# Patient Record
Sex: Female | Born: 1980 | Race: White | Hispanic: Yes | Marital: Married | Smoking: Never smoker
Health system: Southern US, Community
[De-identification: ages and names within clinical notes are randomized; demographics above are authoritative.]

---

## 2005-05-07 ENCOUNTER — Ambulatory Visit: Payer: Self-pay | Admitting: Family Medicine

## 2005-07-25 ENCOUNTER — Ambulatory Visit (HOSPITAL_COMMUNITY): Admission: RE | Admit: 2005-07-25 | Discharge: 2005-07-25 | Payer: Self-pay | Admitting: Obstetrics

## 2005-12-07 ENCOUNTER — Encounter (INDEPENDENT_AMBULATORY_CARE_PROVIDER_SITE_OTHER): Payer: Self-pay | Admitting: *Deleted

## 2005-12-07 ENCOUNTER — Inpatient Hospital Stay (HOSPITAL_COMMUNITY): Admission: RE | Admit: 2005-12-07 | Discharge: 2005-12-10 | Payer: Self-pay | Admitting: Obstetrics

## 2007-05-23 ENCOUNTER — Ambulatory Visit: Payer: Self-pay | Admitting: Nurse Practitioner

## 2007-05-23 DIAGNOSIS — L259 Unspecified contact dermatitis, unspecified cause: Secondary | ICD-10-CM

## 2007-05-23 LAB — CONVERTED CEMR LAB
Albumin: 4.6 g/dL (ref 3.5–5.2)
BUN: 15 mg/dL (ref 6–23)
Basophils Absolute: 0 10*3/uL (ref 0.0–0.1)
Basophils Relative: 1 % (ref 0–1)
Bilirubin Urine: NEGATIVE
CO2: 22 meq/L (ref 19–32)
Chloride: 104 meq/L (ref 96–112)
Cholesterol: 166 mg/dL (ref 0–200)
Eosinophils Absolute: 0.4 10*3/uL (ref 0.0–0.7)
Eosinophils Relative: 7 % — ABNORMAL HIGH (ref 0–5)
Glucose, Bld: 75 mg/dL (ref 70–99)
HCT: 42.6 % (ref 36.0–46.0)
Hemoglobin: 14 g/dL (ref 12.0–15.0)
Ketones, urine, test strip: NEGATIVE
LDL Cholesterol: 91 mg/dL (ref 0–99)
MCV: 89.1 fL (ref 78.0–100.0)
Monocytes Relative: 8 % (ref 3–12)
Potassium: 4.7 meq/L (ref 3.5–5.3)
Protein, U semiquant: NEGATIVE
RBC: 4.78 M/uL (ref 3.87–5.11)
Sodium: 139 meq/L (ref 135–145)
Specific Gravity, Urine: 1.02
TSH: 2.556 microintl units/mL (ref 0.350–5.50)
Total Bilirubin: 0.6 mg/dL (ref 0.3–1.2)
Total Protein: 7.2 g/dL (ref 6.0–8.3)
Urobilinogen, UA: 0.2
VLDL: 38 mg/dL (ref 0–40)
WBC Urine, dipstick: NEGATIVE
pH: 6

## 2007-05-26 ENCOUNTER — Encounter (INDEPENDENT_AMBULATORY_CARE_PROVIDER_SITE_OTHER): Payer: Self-pay | Admitting: Nurse Practitioner

## 2007-05-27 ENCOUNTER — Telehealth (INDEPENDENT_AMBULATORY_CARE_PROVIDER_SITE_OTHER): Payer: Self-pay | Admitting: Nurse Practitioner

## 2007-05-28 ENCOUNTER — Ambulatory Visit: Payer: Self-pay | Admitting: *Deleted

## 2007-07-01 ENCOUNTER — Ambulatory Visit: Payer: Self-pay | Admitting: Nurse Practitioner

## 2007-07-01 DIAGNOSIS — B379 Candidiasis, unspecified: Secondary | ICD-10-CM | POA: Insufficient documentation

## 2007-07-01 LAB — CONVERTED CEMR LAB
Blood in Urine, dipstick: NEGATIVE
Specific Gravity, Urine: <1.005
Urobilinogen, UA: 0.2
pH: 6

## 2008-01-02 ENCOUNTER — Ambulatory Visit: Payer: Self-pay | Admitting: Nurse Practitioner

## 2008-01-02 DIAGNOSIS — R143 Flatulence: Secondary | ICD-10-CM

## 2008-01-02 DIAGNOSIS — R142 Eructation: Secondary | ICD-10-CM

## 2008-01-02 DIAGNOSIS — R141 Gas pain: Secondary | ICD-10-CM

## 2008-01-02 LAB — CONVERTED CEMR LAB
Bilirubin Urine: NEGATIVE
Blood in Urine, dipstick: NEGATIVE
Glucose, Urine, Semiquant: NEGATIVE
KOH Prep: NEGATIVE
Ketones, urine, test strip: NEGATIVE
Nitrite: NEGATIVE
Specific Gravity, Urine: <1.005
WBC Urine, dipstick: NEGATIVE
pH: 7

## 2008-03-29 ENCOUNTER — Emergency Department (HOSPITAL_COMMUNITY): Admission: EM | Admit: 2008-03-29 | Discharge: 2008-03-30 | Payer: Self-pay | Admitting: Emergency Medicine

## 2009-01-12 ENCOUNTER — Ambulatory Visit: Payer: Self-pay | Admitting: Nurse Practitioner

## 2009-01-12 DIAGNOSIS — S335XXA Sprain of ligaments of lumbar spine, initial encounter: Secondary | ICD-10-CM

## 2009-01-12 DIAGNOSIS — M79609 Pain in unspecified limb: Secondary | ICD-10-CM

## 2009-01-12 DIAGNOSIS — L84 Corns and callosities: Secondary | ICD-10-CM | POA: Insufficient documentation

## 2009-01-12 LAB — CONVERTED CEMR LAB
Glucose, Urine, Semiquant: NEGATIVE
Nitrite: NEGATIVE
Urobilinogen, UA: 0.2

## 2009-02-25 ENCOUNTER — Ambulatory Visit: Payer: Self-pay | Admitting: Nurse Practitioner

## 2009-02-27 LAB — CONVERTED CEMR LAB
ALT: 12 units/L (ref 0–35)
Albumin: 4.6 g/dL (ref 3.5–5.2)
Basophils Absolute: 0 10*3/uL (ref 0.0–0.1)
Basophils Relative: 1 % (ref 0–1)
Glucose, Bld: 69 mg/dL — ABNORMAL LOW (ref 70–99)
HDL: 40 mg/dL (ref >39)
Hemoglobin: 13.8 g/dL (ref 12.0–15.0)
Lymphocytes Relative: 38 % (ref 12–46)
Lymphs Abs: 2.2 10*3/uL (ref 0.7–4.0)
MCHC: 32.3 g/dL (ref 30.0–36.0)
Monocytes Absolute: 0.4 10*3/uL (ref 0.1–1.0)
Monocytes Relative: 6 % (ref 3–12)
Neutro Abs: 2.8 10*3/uL (ref 1.7–7.7)
Neutrophils Relative %: 51 % (ref 43–77)
RBC: 4.71 M/uL (ref 3.87–5.11)
RDW: 13.7 % (ref 11.5–15.5)
Sodium: 141 meq/L (ref 135–145)
Total Bilirubin: 0.5 mg/dL (ref 0.3–1.2)
WBC: 5.6 10*3/uL (ref 4.0–10.5)

## 2009-03-10 ENCOUNTER — Ambulatory Visit: Payer: Self-pay | Admitting: Nurse Practitioner

## 2009-03-10 DIAGNOSIS — K5900 Constipation, unspecified: Secondary | ICD-10-CM | POA: Insufficient documentation

## 2009-03-10 DIAGNOSIS — R319 Hematuria, unspecified: Secondary | ICD-10-CM

## 2009-03-10 DIAGNOSIS — B373 Candidiasis of vulva and vagina: Secondary | ICD-10-CM

## 2009-03-10 LAB — CONVERTED CEMR LAB
Bilirubin Urine: NEGATIVE
Chlamydia, DNA Probe: NEGATIVE
GC Probe Amp, Genital: NEGATIVE
Glucose, Urine, Semiquant: NEGATIVE
Nitrite: NEGATIVE
Protein, U semiquant: NEGATIVE
Specific Gravity, Urine: 1.015
Urobilinogen, UA: 0.2
pH: 5

## 2009-03-11 ENCOUNTER — Encounter (INDEPENDENT_AMBULATORY_CARE_PROVIDER_SITE_OTHER): Payer: Self-pay | Admitting: Nurse Practitioner

## 2009-03-21 ENCOUNTER — Encounter (INDEPENDENT_AMBULATORY_CARE_PROVIDER_SITE_OTHER): Payer: Self-pay | Admitting: Nurse Practitioner

## 2010-05-09 NOTE — Progress Notes (Signed)
Summary: Office Visit/DEPRESSION SCREENING  Office Visit/DEPRESSION SCREENING   Imported By: Arta Bruce 04/29/2009 12:14:52  _____________________________________________________________________  External Attachment:    Type:   Image     Comment:   External Document

## 2010-08-25 NOTE — Discharge Summary (Signed)
[name], ISLAND              ACCOUNT NO.:  [id number]   MEDICAL RECORD NO.:  [id number]          PATIENT TYPE:  INP   LOCATION:  9139                          FACILITY:  [facility]   PHYSICIAN:  Kathreen Cosier, M.D.DATE OF BIRTH:  1980/05/05   DATE OF ADMISSION:  12/07/2005  DATE OF DISCHARGE:  12/10/2005                                 DISCHARGE SUMMARY   Patient is a 30 year old gravida 3, para 2, 0-0-2, [facility] December 10, 2005.  She had two previous cesarean sections and was admitted for repeat C-section  and tubal ligation at term.  The patient underwent a repeat low transverse  cesarean section and tubal ligation.  She had a female, Apgars 8 and 9,  weighing 7 pounds and 5 ounces.  Postoperatively, she did well.   On admission, her hemoglobin was 13.8 and 11.7 postop.  Platelets 171 and  149.  Sodium 135, potassium 3.6, chloride 103.  Her RPR negative.  PT/PTT  normal.  The remainder of the labs can be found in her prenatal record.   She was discharged home on the third postoperative day, December 10, 2005,  on Tylox, to see me in six weeks.   DISCHARGE DIAGNOSES:  1. Status post repeat low transverse cesarean section.  2. Tubal ligation, at term.           ______________________________  Kathreen Cosier, M.D.     BAM/MEDQ  D:  01/02/2006  T:  01/03/2006  Job:  [id number]

## 2010-08-25 NOTE — Op Note (Signed)
Judith Garza, Judith Garza              ACCOUNT NO.:  [id number]   MEDICAL RECORD NO.:  [id number]          PATIENT TYPE:  INP   LOCATION:  9139                          FACILITY:  [facility]   PHYSICIAN:  Kathreen Cosier, M.D.DATE OF BIRTH:  23-Feb-1981   DATE OF PROCEDURE:  12/07/2005  DATE OF DISCHARGE:                                 OPERATIVE REPORT   PREOPERATIVE DIAGNOSES:  Previous cesarean section at term, desires repeat  and sterilization.   POSTOPERATIVE DIAGNOSES:  Not given.   SURGEON:  Dr. Francoise Ceo.   FIRST ASSISTANT:  Dr. Clearance Coots.   ANESTHESIA:  Spinal.   DESCRIPTION OF PROCEDURE:  The patient placed on the operating table in  supine position. After the spinal administered, the abdomen prepped and  draped, bladder emptied with Foley catheter. A transverse suprapubic  incision made and carried down to the rectus fascia, the fascia cleaned and  incised the length of the incision. The recti muscles retracted laterally.  The peritoneum incised longitudinally, transverse incision made in the  visceroperitoneum above the bladder. The bladder mobilized. A transverse  lower uterine incision made, the fluid was clear. The patient delivered from  an LOA of a female Apgar 8 and 9 weighing 7 pounds and 5 ounces. The placenta  was posterior and removed manually, sent to labor and delivery. The uterine  cavity cleaned with dry laps, uterine incision closed in one layer with  continuous suture of #1 chromic. Hemostasis satisfactory. Bladder flap  reattached with 2-0 chromic. The right tube was grasped in the mid portion  with a Babcock clamp and #0 plain suture placed on the mesosalpinx below the  portion of tube and clamped. This was tied and approximated and 1 inch of  the tube transected. The procedure was done in identical fashion on the  other side.  Lap and sponge counts correct.  Abdomen closed in layers, the  peritoneum with continuous suture of #0 chromic, fascia continuous  suture of  #0 Dexon and the skin closed with subcuticular stitch of 4-0 Monocryl. Blood  loss 500 mL.           ______________________________  Kathreen Cosier, M.D.     BAM/MEDQ  D:  12/07/2005  T:  12/08/2005  Job:  [id number]

## 2011-01-12 LAB — COMPREHENSIVE METABOLIC PANEL
ALT: 20 U/L (ref 0–35)
AST: 25 U/L (ref 0–37)
Albumin: 4.2 g/dL (ref 3.5–5.2)
BUN: 11 mg/dL (ref 6–23)
Calcium: 8.9 mg/dL (ref 8.4–10.5)
Glucose, Bld: 107 mg/dL — ABNORMAL HIGH (ref 70–99)
Potassium: 3.5 mEq/L (ref 3.5–5.1)
Sodium: 133 mEq/L — ABNORMAL LOW (ref 135–145)
Total Bilirubin: 1 mg/dL (ref 0.3–1.2)
Total Protein: 6.6 g/dL (ref 6.0–8.3)

## 2011-01-12 LAB — CBC
Hemoglobin: 13.9 g/dL (ref 12.0–15.0)
MCHC: 33.8 g/dL (ref 30.0–36.0)
MCV: 87.1 fL (ref 78.0–100.0)
WBC: 9.7 10*3/uL (ref 4.0–10.5)

## 2011-01-12 LAB — URINALYSIS, ROUTINE W REFLEX MICROSCOPIC
Nitrite: NEGATIVE
Protein, ur: NEGATIVE mg/dL
Urobilinogen, UA: 1 mg/dL (ref 0.0–1.0)
pH: 7.5 (ref 5.0–8.0)

## 2011-01-12 LAB — WET PREP, GENITAL
Clue Cells Wet Prep HPF POC: NONE SEEN
Trich, Wet Prep: NONE SEEN
WBC, Wet Prep HPF POC: NONE SEEN

## 2011-01-12 LAB — GC/CHLAMYDIA PROBE AMP, GENITAL: GC Probe Amp, Genital: NEGATIVE

## 2011-01-12 LAB — DIFFERENTIAL
Eosinophils Relative: 3 % (ref 0–5)
Monocytes Relative: 6 % (ref 3–12)

## 2011-01-12 LAB — LIPASE, BLOOD: Lipase: 21 U/L (ref 11–59)

## 2011-01-12 LAB — POCT PREGNANCY, URINE: Preg Test, Ur: NEGATIVE

## 2012-01-10 ENCOUNTER — Ambulatory Visit (INDEPENDENT_AMBULATORY_CARE_PROVIDER_SITE_OTHER): Payer: Self-pay | Admitting: Family Medicine

## 2012-01-10 ENCOUNTER — Encounter: Payer: Self-pay | Admitting: Family Medicine

## 2012-01-10 VITALS — BP 94/59 | HR 73 | Temp 99.1°F | Ht 64.0 in | Wt 158.0 lb

## 2012-01-10 DIAGNOSIS — IMO0001 Reserved for inherently not codable concepts without codable children: Secondary | ICD-10-CM

## 2012-01-10 DIAGNOSIS — Z719 Counseling, unspecified: Secondary | ICD-10-CM

## 2012-01-10 DIAGNOSIS — Z0000 Encounter for general adult medical examination without abnormal findings: Secondary | ICD-10-CM | POA: Insufficient documentation

## 2012-01-10 NOTE — Progress Notes (Signed)
Interpreter Shyne Lehrke Namihira for Dr Hairford 

## 2012-01-10 NOTE — Patient Instructions (Signed)
It was nice to meet you today!  Please stop by the front desk to give your name and information to be contacted about the office's "Orange Card."  Also, please make an appointment in the next few months to have a complete physical with pap smear.  Thank you! Please let me know if you need anything! Conchita Truxillo M. Rishi Vicario, M.D.

## 2012-01-10 NOTE — Progress Notes (Signed)
Patient ID: Judith Garza, female   DOB: Nov 22, 1980, 31 y.o.   MRN: [id number] [facility] Judith Franze M. Judith Robling, MD Phone: [phone]   Subjective: HPI: Patient is a 31 y.o. female presenting to clinic today for new patient appointment. Previously seen by [facility]. Last appointment was 4 years ago.  Interpreter was present throughout history and physcial.  Concerns today include health maintenance. Patient would like to have a pap smear since it has been a long time   PMH: None PSH: Csection x3 with BTL Family history: Mom- DM and HLD Social history: Lives with mom, husband and 3 children. Does not smoke. No etoh or drug use.  Health Maintenance: Pap smear 5 years ago. No abnormal pap smears in the past. No flu shot this year. Unsure of last Td. S/p BTL, LMP 9/28 No dentist or eye exam.   ROS: Denies HA, CP, SOB, Abd Pain, Leg swelling.  Objective: Office vital signs reviewed.  Physical Examination:  General: Awake, alert. NAD HEENT: Atraumatic, normocephalic. Posterior pharynx clear Neck: No masses palpated. No LAD Pulm: CTAB, no wheezes Cardio: RRR, no murmurs appreciated Abdomen:+BS, soft, nontender, nondistended Extremities: No edema Neuro: Grossly intact  Assessment: 31 yo healthy female presenting to establish care  Plan: See Problem List and After Visit Summary

## 2012-01-10 NOTE — Assessment & Plan Note (Signed)
Patient is healthy. No PMH or current concerns. Will need pap smear, and flu shot. UTD on Td.

## 2012-05-08 ENCOUNTER — Encounter: Payer: Self-pay | Admitting: Family Medicine

## 2012-06-06 ENCOUNTER — Ambulatory Visit (INDEPENDENT_AMBULATORY_CARE_PROVIDER_SITE_OTHER): Payer: No Typology Code available for payment source | Admitting: Family Medicine

## 2012-06-06 ENCOUNTER — Other Ambulatory Visit (HOSPITAL_COMMUNITY)
Admission: RE | Admit: 2012-06-06 | Discharge: 2012-06-06 | Disposition: A | Discharge status: Home / Self Care | Payer: No Typology Code available for payment source | Source: Ambulatory Visit | Attending: Family Medicine | Admitting: Family Medicine

## 2012-06-06 ENCOUNTER — Encounter: Payer: Self-pay | Admitting: Family Medicine

## 2012-06-06 VITALS — BP 124/80 | HR 77 | Ht 59.5 in | Wt 163.0 lb

## 2012-06-06 DIAGNOSIS — N760 Acute vaginitis: Secondary | ICD-10-CM

## 2012-06-06 DIAGNOSIS — Z1151 Encounter for screening for human papillomavirus (HPV): Secondary | ICD-10-CM | POA: Insufficient documentation

## 2012-06-06 DIAGNOSIS — Z124 Encounter for screening for malignant neoplasm of cervix: Secondary | ICD-10-CM

## 2012-06-06 DIAGNOSIS — R102 Pelvic and perineal pain unspecified side: Secondary | ICD-10-CM

## 2012-06-06 DIAGNOSIS — R300 Dysuria: Secondary | ICD-10-CM

## 2012-06-06 DIAGNOSIS — Z113 Encounter for screening for infections with a predominantly sexual mode of transmission: Secondary | ICD-10-CM | POA: Insufficient documentation

## 2012-06-06 DIAGNOSIS — N898 Other specified noninflammatory disorders of vagina: Secondary | ICD-10-CM

## 2012-06-06 DIAGNOSIS — Z01419 Encounter for gynecological examination (general) (routine) without abnormal findings: Secondary | ICD-10-CM | POA: Insufficient documentation

## 2012-06-06 LAB — POCT URINALYSIS DIPSTICK
Bilirubin, UA: NEGATIVE
Glucose, UA: NEGATIVE
Ketones, UA: NEGATIVE
Nitrite, UA: NEGATIVE
Spec Grav, UA: 1.01
Urobilinogen, UA: 0.2

## 2012-06-06 LAB — POCT WET PREP (WET MOUNT): Clue Cells Wet Prep Whiff POC: NEGATIVE

## 2012-06-06 LAB — POCT URINE PREGNANCY: Preg Test, Ur: NEGATIVE

## 2012-06-06 NOTE — Assessment & Plan Note (Signed)
Wet prep and UA negative. No obvious lesions on exam that could be contributing to her symptoms. Advised to use lubricant and avoid all perfumes/lotions/detergents that may irritate her.  Pap smear collected today since it was due. Will send letter with results. GC/Ch still pending as well. The diagnosis was discussed with the patient and evaluation and treatment plans outlined. Reassured patient that symptoms are almost certainly benign and self-resolving.

## 2012-06-06 NOTE — Patient Instructions (Signed)
It was good to see you today.  You do not have a urinary infection.   I will send you a letter with your pap smear results when they come back.  I will see you as needed.  Marvelle Span M. Marieke Lubke, M.D.

## 2012-06-06 NOTE — Progress Notes (Signed)
  Subjective:     Judith Garza is a 32 y.o. female who presents for evaluation of pelvic pain. The pain is described as aching. Pain is located in the suprapubic and deep pelvis area without radiation. Onset was intermittent occurring several months ago. Symptoms have been unchanged since. Aggravating factors: urination and intercourse. Associated symptoms: dysuria and vaginal discharge. The patient denies chills and fever. Describes vaginal discharge as white with some odor. She states she has never had an STD before. No new partners. She is s/p BTL and states she is not pregnant. Risk factors for pelvic/abdominal pain include prior pelvic surgery.  Menstrual History: OB History   Grav Para Term Preterm Abortions TAB SAB Ect Mult Living                 Obstetric Comments   3 children via Csection. Judith Garza was too big. Judith Garza delivered quickly and Judith Garza was decided to be a C-section.      No LMP recorded.   The following portions of the patient's history were reviewed and updated as appropriate: allergies, current medications, past family history, past medical history, past social history, past surgical history and problem list.   Review of Systems Pertinent items are noted in HPI.    Objective:    BP 124/80  Pulse 77  Ht 4' 11.5" (1.511 m)  Wt 163 lb (73.936 kg)  BMI 32.38 kg/m2 General:   alert and cooperative  Lungs:   clear to auscultation bilaterally  Heart:   regular rate and rhythm, S1, S2 normal, no murmur, click, rub or gallop  Abdomen:  soft, BS wnl. Suprapubic tenderness without flank pain  CVA:   absent  Pelvis:  Vulva and vagina appear normal. Bimanual exam reveals normal uterus and adnexa. External genitalia: normal general appearance  Extremities:   extremities normal, atraumatic, no cyanosis or edema  Neurologic:   negative  Psychiatric:   normal mood, behavior, speech, dress, and thought processes   Lab Review Labs: Urine pregnancy test, Urinalysis - with micro,  GC/Chlamydia DNA probe of cervico/vaginal secretions, Genital culture, Wet mount of vaginal secretions and pap smear   Imaging None indicated at this time    Assessment:    Functional: Could be secondary to vaginal dryness. Awaiting GC/Ch cultures, but wet prep and UA unremarkable    Plan:    The diagnosis was discussed with the patient and evaluation and treatment plans outlined. Reassured patient that symptoms are almost certainly benign and self-resolving.

## 2013-01-02 ENCOUNTER — Ambulatory Visit: Payer: Self-pay

## 2014-02-08 ENCOUNTER — Encounter: Payer: Self-pay | Admitting: Family Medicine

## 2014-08-08 ENCOUNTER — Emergency Department (HOSPITAL_COMMUNITY)
Admission: EM | Admit: 2014-08-08 | Discharge: 2014-08-08 | Disposition: A | Discharge status: Home / Self Care | Payer: No Typology Code available for payment source | Source: Home / Self Care | Attending: Family Medicine | Admitting: Family Medicine

## 2014-08-08 DIAGNOSIS — J029 Acute pharyngitis, unspecified: Secondary | ICD-10-CM

## 2014-08-08 LAB — POCT RAPID STREP A: Streptococcus, Group A Screen (Direct): NEGATIVE

## 2014-08-08 MED ORDER — IPRATROPIUM BROMIDE 0.06 % NA SOLN: 2.0000 | Freq: Four times a day (QID) | NASAL | Status: AC

## 2014-08-08 NOTE — ED Provider Notes (Addendum)
CSN: [id number]     Arrival date & time 08/08/14  1159 History   First MD Initiated Contact with Patient 08/08/14 1325     Chief Complaint  Patient presents with  . Sore Throat   (Consider location/radiation/quality/duration/timing/severity/associated sxs/prior Treatment) Patient is a 34 y.o. female presenting with pharyngitis. The history is provided by the patient.  Sore Throat This is a new problem. The current episode started more than 1 week ago (2 wks of sx ). The problem has not changed since onset.Pertinent negatives include no chest pain, no abdominal pain, no headaches and no shortness of breath.    No past medical history on file. Past Surgical History  Procedure Laterality Date  . Cesarean section      three   Family History  Problem Relation Age of Onset  . Diabetes Mother   . Hyperlipidemia Mother    History  Substance Use Topics  . Smoking status: Never Smoker   . Smokeless tobacco: Not on file  . Alcohol Use: No   OB History    Obstetric Comments   3 children via Csection. [name] was too big. [name] delivered quickly and [name] was decided to be a C-section.     Review of Systems  Constitutional: Negative for fever and chills.  HENT: Positive for congestion, postnasal drip, rhinorrhea and sore throat.   Respiratory: Negative.  Negative for cough and shortness of breath.   Cardiovascular: Negative.  Negative for chest pain.  Gastrointestinal: Negative.  Negative for abdominal pain.  Neurological: Negative for headaches.    Allergies  Review of patient's allergies indicates no known allergies.  Home Medications   Prior to Admission medications   Medication Sig Start Date End Date Taking? Authorizing Provider  ipratropium (ATROVENT) 0.06 % nasal spray Place 2 sprays into both nostrils 4 (four) times daily. 08/08/14   Linna HoffJames D Ashleynicole Mcclees, MD   BP 125/76 mmHg  Pulse 62  Temp(Src) 98.5 F (36.9 C) (Oral)  Resp 18  SpO2 100%  LMP  (LMP Unknown) Physical Exam   Constitutional: She is oriented to person, place, and time. She appears well-developed and well-nourished. No distress.  HENT:  Head: Normocephalic.  Right Ear: External ear normal.  Left Ear: External ear normal.  Nose: Nose normal.  Mouth/Throat: Oropharynx is clear and moist. No oropharyngeal exudate.  Eyes: Pupils are equal, round, and reactive to light.  Neck: Normal range of motion. Neck supple.  Cardiovascular: Normal rate, regular rhythm and normal heart sounds.   Pulmonary/Chest: Effort normal and breath sounds normal.  Lymphadenopathy:    She has no cervical adenopathy.  Neurological: She is alert and oriented to person, place, and time.  Skin: Skin is warm and dry.  Nursing note and vitals reviewed.   ED Course  Procedures (including critical care time) Labs Review Labs Reviewed  POCT RAPID STREP A ([facility] ONLY)    Imaging Review No results found.   MDM   1. Acute pharyngitis, unspecified pharyngitis type        Linna HoffJames D Mccoy Testa, MD 08/08/14 1424  Linna HoffJames D Almond Fitzgibbon, MD 08/08/14 1425

## 2014-08-08 NOTE — ED Notes (Signed)
Sore throat x 2 weeks. No fever. AUpright,RMA

## 2014-08-10 LAB — CULTURE, GROUP A STREP: STREP A CULTURE: NEGATIVE

## 2016-01-15 ENCOUNTER — Ambulatory Visit (HOSPITAL_COMMUNITY)
Admission: EM | Admit: 2016-01-15 | Discharge: 2016-01-15 | Disposition: A | Discharge status: Home / Self Care | Payer: Self-pay | Attending: Internal Medicine | Admitting: Internal Medicine

## 2016-01-15 ENCOUNTER — Encounter (HOSPITAL_COMMUNITY): Payer: Self-pay | Admitting: Family Medicine

## 2016-01-15 DIAGNOSIS — M940 Chondrocostal junction syndrome [Tietze]: Secondary | ICD-10-CM

## 2016-01-15 DIAGNOSIS — R0789 Other chest pain: Secondary | ICD-10-CM

## 2016-01-15 MED ORDER — FAMOTIDINE 40 MG PO TABS: 40.0000 mg | ORAL_TABLET | Freq: Two times a day (BID) | ORAL | 0 refills | Status: AC

## 2016-01-15 MED ORDER — NAPROXEN 500 MG PO TABS: 500.0000 mg | ORAL_TABLET | Freq: Two times a day (BID) | ORAL | 0 refills | Status: AC

## 2016-01-15 NOTE — ED Provider Notes (Signed)
[facility]    CSN: [id number] Arrival date & time: 01/15/16  1156     History   Chief Complaint [id number] Complaint  Patient presents with  . Chest Pain    HPI Judith Garza is a 35 y.o. female. 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 This was intermittent at onset, but has been fairly constant today. No cough, no dyspnea, no fever. No nausea/vomiting, good appetite. No unusual leg pain or swelling. No prior history of this symptom. No unusual activities recently, no trauma. Not taking the birth control pill. No family history of early MI; no personal history of smoking, diabetes, hypertension.  HPI  History reviewed. No pertinent past medical history.  Patient Active Problem List   Diagnosis Date Noted  . Vaginitis and vulvovaginitis 06/06/2012  . Encounter to establish care with new doctor 01/10/2012  . Consultation without specific complaint 01/10/2012  . VAGINITIS, CANDIDAL 03/10/2009  . CONSTIPATION 03/10/2009  . HEMATURIA UNSPECIFIED 03/10/2009  . CALLUS, LEFT FOOT 01/12/2009  . FOOT PAIN, LEFT 01/12/2009  . LUMBAR STRAIN 01/12/2009  . FLATULENCE 01/02/2008  . CANDIDIASIS 07/01/2007  . ECZEMA 05/23/2007    Past Surgical History:  Procedure Laterality Date  . CESAREAN SECTION     three    OB History    Obstetric Comments   3 children via Csection. [name] was too big. [name] delivered quickly and [name] was decided to be a C-section.       Home Medications    Prior to Admission medications   Medication Sig Start Date End Date Taking? Authorizing Provider  famotidine (PEPCID) 40 MG tablet Take 1 tablet (40 mg total) by mouth 2 (two) times daily. 01/15/16 01/29/16  Eustace MooreLaura W Murray, MD  ipratropium (ATROVENT) 0.06 % nasal spray Place 2 sprays into both nostrils 4 (four) times daily. 08/08/14   Linna HoffJames D  Kindl, MD  naproxen (NAPROSYN) 500 MG tablet Take 1 tablet (500 mg total) by mouth 2 (two) times daily. 01/15/16   Eustace MooreLaura W Murray, MD    Family History Family History  Problem Relation Age of Onset  . Diabetes Mother   . Hyperlipidemia Mother     Social History Social History  Substance Use Topics  . Smoking status: Never Smoker  . Smokeless tobacco: Never Used  . Alcohol use No     Allergies   Review of patient's allergies indicates no known allergies.   Review of Systems Review of Systems  All other systems reviewed and are negative.    Physical Exam Triage Vital Signs ED Triage Vitals  Enc Vitals Group     BP 01/15/16 1211 132/87     Pulse Rate 01/15/16 1211 60     Resp 01/15/16 1211 16     Temp 01/15/16 1211 98.1 F (36.7 C)     Temp src --      SpO2 01/15/16 1211 97 %     Weight --      Height --      Pain Score 01/15/16 1213 5   Updated Vital Signs BP 132/87   Pulse 60   Temp 98.1 F (36.7 C)   Resp 16   SpO2 97%  Physical Exam  Constitutional: She is oriented to person, place, and time. No distress.  Alert, nicely groomed  HENT:  Head: Atraumatic.  Eyes:  Conjugate  gaze, no eye redness/drainage  Neck: Neck supple.  Cardiovascular: Normal rate and regular rhythm.   Pulmonary/Chest: No respiratory distress. She has no wheezes. She has no rales.  Lungs clear, symmetric breath sounds  Abdominal: Soft. She exhibits no distension. There is no rebound and no guarding.  Slightly tender to deep palpation in the epigastrium  Musculoskeletal: Normal range of motion.  No leg swelling Full and symmetric arm motion at both shoulders, does not reproduce discomfort Palpation of the upper left costosternal joints increases discomfort  Neurological: She is alert and oriented to person, place, and time.  Skin: Skin is warm and dry.  No cyanosis  Nursing note and vitals reviewed.    UC Treatments / Results   Procedures Procedures (including critical  care time)      None today   Final Clinical Impressions(s) / UC Diagnoses   Final diagnoses:  Atypical chest pain  Costochondritis, acute   Chest discomfort today seems most likely to be due to irritation of the joints where the upper left ribs join the breastbone.   Prescriptions for naproxen (anti inflammatory) and famotidine (to protect stomach while taking anti inflammatory) were sent to the [facility] at [facility].  Anticipate gradual improvement in discomfort over the next 1-2 weeks.  Recheck or followup with primary care provider, Abdoulaye Diallo for further evaluation if symptoms persist.  New Prescriptions New Prescriptions   FAMOTIDINE (PEPCID) 40 MG TABLET    Take 1 tablet (40 mg total) by mouth 2 (two) times daily.   NAPROXEN (NAPROSYN) 500 MG TABLET    Take 1 tablet (500 mg total) by mouth 2 (two) times daily.     Eustace Moore, MD 01/19/16 1100

## 2016-01-15 NOTE — ED Triage Notes (Signed)
Pt here for central chest pain that is reproducible and is aggravated by movement. 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

## 2016-01-15 NOTE — Discharge Instructions (Addendum)
Chest discomfort today seems most likely to be due to irritation of the joints where the upper left ribs join the breastbone.   Prescriptions for naproxen (anti inflammatory) and famotidine (to protect stomach while taking anti inflammatory) [facility].  Anticipate gradual improvement in discomfort over the next 1-2 weeks.  Recheck or followup with primary care provider, Abdoulaye Diallo for further evaluation if symptoms persist.

## 2016-01-19 ENCOUNTER — Emergency Department (HOSPITAL_COMMUNITY): Payer: Self-pay

## 2016-01-19 ENCOUNTER — Encounter (HOSPITAL_COMMUNITY): Payer: Self-pay | Admitting: Emergency Medicine

## 2016-01-19 ENCOUNTER — Emergency Department (HOSPITAL_COMMUNITY)
Admission: EM | Admit: 2016-01-19 | Discharge: 2016-01-19 | Disposition: A | Discharge status: Home / Self Care | Payer: Self-pay | Attending: Emergency Medicine | Admitting: Emergency Medicine

## 2016-01-19 DIAGNOSIS — R079 Chest pain, unspecified: Secondary | ICD-10-CM | POA: Insufficient documentation

## 2016-01-19 LAB — I-STAT TROPONIN, ED: TROPONIN I, POC: 0 ng/mL (ref 0.00–0.08)

## 2016-01-19 LAB — BASIC METABOLIC PANEL
ANION GAP: 8 (ref 5–15)
BUN: 9 mg/dL (ref 6–20)
CALCIUM: 9.4 mg/dL (ref 8.9–10.3)
CO2: 25 mmol/L (ref 22–32)
Chloride: 105 mmol/L (ref 101–111)
Creatinine, Ser: 0.53 mg/dL (ref 0.44–1.00)
Glucose, Bld: 85 mg/dL (ref 65–99)
Potassium: 3.8 mmol/L (ref 3.5–5.1)
Sodium: 138 mmol/L (ref 135–145)

## 2016-01-19 LAB — CBC
HCT: 39.2 % (ref 36.0–46.0)
HEMOGLOBIN: 13.2 g/dL (ref 12.0–15.0)
MCH: 29.1 pg (ref 26.0–34.0)
MCHC: 33.7 g/dL (ref 30.0–36.0)
MCV: 86.5 fL (ref 78.0–100.0)
Platelets: 284 10*3/uL (ref 150–400)
RBC: 4.53 MIL/uL (ref 3.87–5.11)
RDW: 14.2 % (ref 11.5–15.5)
WBC: 11.2 10*3/uL — AB (ref 4.0–10.5)

## 2016-01-19 NOTE — ED Triage Notes (Signed)
Pt c/o of substernal sharp chest pains x1 week. Pt denies any associated symptoms only pain. Pt is warm and dry. Respirations unlabored and in no acute distress.

## 2016-01-19 NOTE — ED Provider Notes (Signed)
[facility] DEPT Provider Note   CSN: [id number] Arrival date & time: 01/19/16  1559     History   Chief Complaint Chief Complaint  Patient presents with  . Chest Pain    HPI Judith Garza is a 35 y.o. female.  HPI  The pt has had 2 weeks of chest pain, Has had left-sided upper chest pain which seems to be worse with palpation and moving, does not occur with exertion, no associated shortness of breath fever chills or cough. No swelling of the legs. No recent travel trauma injury or surgery. No smoking. No history of cancer. No history of blood clot. No history of early coronary disease in the family. She was seen at the urgent care and was given Naprosyn and famotidine, she states it is not helping that much. Her symptoms are constant for the last 2 weeks. It does not seem to be getting better or worse. She wants a second opinion as to the cause of her pain.   History reviewed. No pertinent past medical history.  Patient Active Problem List   Diagnosis Date Noted  . Vaginitis and vulvovaginitis 06/06/2012  . Encounter to establish care with new doctor 01/10/2012  . Consultation without specific complaint 01/10/2012  . VAGINITIS, CANDIDAL 03/10/2009  . CONSTIPATION 03/10/2009  . HEMATURIA UNSPECIFIED 03/10/2009  . CALLUS, LEFT FOOT 01/12/2009  . FOOT PAIN, LEFT 01/12/2009  . LUMBAR STRAIN 01/12/2009  . FLATULENCE 01/02/2008  . CANDIDIASIS 07/01/2007  . ECZEMA 05/23/2007    Past Surgical History:  Procedure Laterality Date  . CESAREAN SECTION     three    OB History    Obstetric Comments   3 children via Csection. [name] was too big. [name] delivered quickly and [name] was decided to be a C-section.       Home Medications    Prior to Admission medications   Medication Sig Start Date End Date Taking? Authorizing Provider  famotidine (PEPCID) 40 MG tablet Take 1 tablet (40 mg total) by mouth 2 (two) times daily. 01/15/16 01/29/16  Eustace MooreLaura W Murray, MD  ipratropium  (ATROVENT) 0.06 % nasal spray Place 2 sprays into both nostrils 4 (four) times daily. 08/08/14   Linna HoffJames D Kindl, MD  naproxen (NAPROSYN) 500 MG tablet Take 1 tablet (500 mg total) by mouth 2 (two) times daily. 01/15/16   Eustace MooreLaura W Murray, MD    Family History Family History  Problem Relation Age of Onset  . Diabetes Mother   . Hyperlipidemia Mother     Social History Social History  Substance Use Topics  . Smoking status: Never Smoker  . Smokeless tobacco: Never Used  . Alcohol use No     Allergies   Review of patient's allergies indicates no known allergies.   Review of Systems Review of Systems  All other systems reviewed and are negative.    Physical Exam Updated Vital Signs BP 108/72 (BP Location: Left Arm)   Pulse 64   Temp 98.5 F (36.9 C) (Oral)   Resp 20   Ht 5\' 4"  (1.626 m)   Wt 160 lb (72.6 kg)   LMP 01/05/2016   SpO2 100%   BMI 27.46 kg/m   Physical Exam  Constitutional: She appears well-developed and well-nourished. No distress.  HENT:  Head: Normocephalic and atraumatic.  Mouth/Throat: Oropharynx is clear and moist. No oropharyngeal exudate.  Eyes: Conjunctivae and EOM are normal. Pupils are equal, round, and reactive to light. Right eye exhibits no discharge. Left eye exhibits no discharge.  No scleral icterus.  Neck: Normal range of motion. Neck supple. No JVD present. No thyromegaly present.  Cardiovascular: Normal rate, regular rhythm, normal heart sounds and intact distal pulses.  Exam reveals no gallop and no friction rub.   No murmur heard. Pulmonary/Chest: Effort normal and breath sounds normal. No respiratory distress. She has no wheezes. She has no rales. She exhibits tenderness ( Reproducible chest tenderness to the left upper chest wall ).  Abdominal: Soft. Bowel sounds are normal. She exhibits no distension and no mass. There is no tenderness.  Musculoskeletal: Normal range of motion. She exhibits no edema or tenderness.  Lymphadenopathy:     She has no cervical adenopathy.  Neurological: She is alert. Coordination normal.  Skin: Skin is warm and dry. No rash noted. No erythema.  Psychiatric: She has a normal mood and affect. Her behavior is normal.  Nursing note and vitals reviewed.    ED Treatments / Results  Labs (all labs ordered are listed, but only abnormal results are displayed) Labs Reviewed  CBC - Abnormal; Notable for the following:       Result Value   WBC 11.2 (*)    All other components within normal limits  BASIC METABOLIC PANEL  I-STAT TROPOININ, ED    EKG  EKG Interpretation  Date/Time:  Thursday January 19 2016 16:02:18 EDT Ventricular Rate:  64 PR Interval:  116 QRS Duration: 84 QT Interval:  400 QTC Calculation: 412 R Axis:   31 Text Interpretation:  Normal sinus rhythm Normal ECG Normal ECG No old tracing to compare Confirmed by Holiday Mcmenamin  MD, Maurion Walkowiak ([id number]) on 01/19/2016 6:46:45 PM       Radiology Dg Chest 2 View  Result Date: 01/19/2016 CLINICAL DATA:  Lambert Mody left-sided chest pain associated with shortness of breath over the past week. EXAM: CHEST  2 VIEW COMPARISON:  None in PACs FINDINGS: The lungs are mildly hypoinflated. There is no focal infiltrate. There is no pleural effusion or pneumothorax. The cardiac silhouette is enlarged. The pulmonary vascularity is normal. The mediastinum is normal in width. There is mild multilevel degenerative disc disease of the thoracic spine. IMPRESSION: Mild hypoinflation. Mild cardiomegaly. No pneumonia, CHF, nor other acute cardiopulmonary abnormality. Electronically Signed   By: David  [place] M.D.   On: 01/19/2016 16:36    Procedures Procedures (including critical care time)  Medications Ordered in ED Medications - No data to display   Initial Impression / Assessment and Plan / ED Course  I have reviewed the triage vital signs and the nursing notes.  Pertinent labs & imaging results that were available during my care of the patient were reviewed  by me and considered in my medical decision making (see chart for details).  Clinical Course    The patient is well-appearing, her x-ray is unremarkable, her labs are unremarkable, with a normal troponin and 2 weeks of near constant pain with reducible pain on palpation I doubt that this is cardiac disease in this young otherwise healthy female. I agree with ongoing anti-inflammatory use, Pepcid, routine follow-up, the patient is in agreement with the plan.  Final Clinical Impressions(s) / ED Diagnoses   Final diagnoses:  Chest pain, unspecified type    New Prescriptions New Prescriptions   No medications on file     Eber Hong, MD 01/21/16 0015

## 2016-01-19 NOTE — Discharge Instructions (Signed)

## 2016-01-19 NOTE — ED Notes (Signed)
Papers reviewed with patient and family. They verbalize follow up instructions and feel ready for discharge. Leaving ambulatory with family

## 2016-06-07 ENCOUNTER — Encounter: Payer: Self-pay | Admitting: Family Medicine

## 2016-06-26 ENCOUNTER — Ambulatory Visit (INDEPENDENT_AMBULATORY_CARE_PROVIDER_SITE_OTHER): Payer: BLUE CROSS/BLUE SHIELD | Admitting: Family Medicine

## 2016-06-26 ENCOUNTER — Encounter: Payer: Self-pay | Admitting: Family Medicine

## 2016-06-26 ENCOUNTER — Other Ambulatory Visit (HOSPITAL_COMMUNITY)
Admission: RE | Admit: 2016-06-26 | Discharge: 2016-06-26 | Disposition: A | Discharge status: Home / Self Care | Payer: BLUE CROSS/BLUE SHIELD | Source: Ambulatory Visit | Attending: Family Medicine | Admitting: Family Medicine

## 2016-06-26 VITALS — BP 100/70 | HR 63 | Temp 99.3°F | Wt 175.0 lb

## 2016-06-26 DIAGNOSIS — E669 Obesity, unspecified: Secondary | ICD-10-CM

## 2016-06-26 DIAGNOSIS — Z6830 Body mass index (BMI) 30.0-30.9, adult: Secondary | ICD-10-CM | POA: Insufficient documentation

## 2016-06-26 DIAGNOSIS — R1031 Right lower quadrant pain: Secondary | ICD-10-CM

## 2016-06-26 DIAGNOSIS — Z0000 Encounter for general adult medical examination without abnormal findings: Secondary | ICD-10-CM

## 2016-06-26 DIAGNOSIS — G8929 Other chronic pain: Secondary | ICD-10-CM

## 2016-06-26 DIAGNOSIS — Z124 Encounter for screening for malignant neoplasm of cervix: Secondary | ICD-10-CM | POA: Diagnosis not present
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
## 2016-06-26 NOTE — Patient Instructions (Signed)
Things to do to Keep yourself Healthy - Exercise at least 30-45 minutes a day,  3-4 days a week.  - Eat a low-fat diet with lots of fruits and vegetables, up to 7-9 servings per day. - Seatbelts can save your life. Wear them always. - Smoke detectors on every level of your home, check batteries every year. - Eye Doctor - have an eye exam every 1-2 years - Safe sex - if you may be exposed to STDs, use a condom. - Alcohol If you drink, do it moderately,less than 2 drinks per day. - Health Care Power of Attorney.  Choose someone to speak for you if you are not able. - Depression is common in our stressful world.If you're feeling down or losing interest in things you normally enjoy, please come in for a visit.

## 2016-06-26 NOTE — Assessment & Plan Note (Signed)
No pain or abnormalities on exam. Given location and monthly presence, most like ovarian source. Suspect related to menses and/or ovarian cyst. Could be endometriosis, however timing of pain is not consistent and the pt states her pain only lasts for 1 day. GC/chalmydia today. -advised pt to f/u in clinic during her next episode so we can appropriately evaluate it. - could consider transvaginal U/S in the future. - continue NSAIDs as needed.
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
## 2016-06-27 LAB — LIPID PANEL
Chol/HDL Ratio: 4.3 ratio units (ref 0.0–4.4)
Cholesterol, Total: 176 mg/dL (ref 100–199)
HDL: 41 mg/dL (ref >39)
LDL Calculated: 99 mg/dL (ref 0–99)
Triglycerides: 180 mg/dL — ABNORMAL HIGH (ref 0–149)
VLDL Cholesterol Cal: 36 mg/dL (ref 5–40)

## 2016-06-29 LAB — CYTOLOGY - PAP
BACTERIAL VAGINITIS: NEGATIVE
Candida vaginitis: NEGATIVE
Chlamydia: NEGATIVE
DIAGNOSIS: NEGATIVE
HPV: NOT DETECTED
Neisseria Gonorrhea: NEGATIVE
Trichomonas: NEGATIVE

## 2016-07-03 ENCOUNTER — Telehealth: Payer: Self-pay | Admitting: Family Medicine

## 2016-07-03 NOTE — Telephone Encounter (Signed)
Please call and let the patient know her pap smear was normal.   Gonorrhea and chlamydia were also negative.   Thanks, Joanna Puffrystal S. Dorsey, MD [facility] Resident  07/03/2016, 12:06 PM

## 2016-07-05 ENCOUNTER — Encounter: Payer: Self-pay | Admitting: *Deleted

## 2016-07-05 NOTE — Telephone Encounter (Signed)
Mailed letter. Fleeger, Jessica Dawn, CMA  

## 2016-08-09 ENCOUNTER — Ambulatory Visit (INDEPENDENT_AMBULATORY_CARE_PROVIDER_SITE_OTHER): Payer: BLUE CROSS/BLUE SHIELD | Admitting: Internal Medicine

## 2016-08-09 ENCOUNTER — Encounter: Payer: Self-pay | Admitting: Internal Medicine

## 2016-08-09 ENCOUNTER — Other Ambulatory Visit (HOSPITAL_COMMUNITY)
Admission: RE | Admit: 2016-08-09 | Discharge: 2016-08-09 | Disposition: A | Discharge status: Home / Self Care | Payer: BLUE CROSS/BLUE SHIELD | Source: Ambulatory Visit | Attending: Family Medicine | Admitting: Family Medicine

## 2016-08-09 VITALS — BP 112/80 | HR 59 | Temp 98.5°F | Ht 64.0 in | Wt 170.4 lb

## 2016-08-09 DIAGNOSIS — R1031 Right lower quadrant pain: Secondary | ICD-10-CM

## 2016-08-09 DIAGNOSIS — G8929 Other chronic pain: Secondary | ICD-10-CM

## 2016-08-09 LAB — POCT URINALYSIS DIP (MANUAL ENTRY)
Bilirubin, UA: NEGATIVE
GLUCOSE UA: NEGATIVE mg/dL
Ketones, POC UA: NEGATIVE mg/dL
Leukocytes, UA: NEGATIVE
NITRITE UA: NEGATIVE
PROTEIN UA: NEGATIVE mg/dL
RBC UA: NEGATIVE
SPEC GRAV UA: 1.015 (ref 1.010–1.025)
UROBILINOGEN UA: 1 U/dL
pH, UA: 7 (ref 5.0–8.0)

## 2016-08-09 LAB — POCT WET PREP (WET MOUNT)
CLUE CELLS WET PREP WHIFF POC: NEGATIVE
Trichomonas Wet Prep HPF POC: ABSENT

## 2016-08-09 LAB — POCT URINE PREGNANCY: Preg Test, Ur: NEGATIVE

## 2016-08-09 MED ORDER — CEFTRIAXONE SODIUM 250 MG IJ SOLR
250.0000 mg | Freq: Once | INTRAMUSCULAR | Status: AC
  Administered 2016-08-09: 250 mg via INTRAMUSCULAR

## 2016-08-09 MED ORDER — DOXYCYCLINE HYCLATE 100 MG PO TABS: 100.0000 mg | ORAL_TABLET | Freq: Two times a day (BID) | ORAL | 0 refills | Status: AC

## 2016-08-09 NOTE — Progress Notes (Signed)
   Subjective:    Judith Garza - 36 y.o. female MRN [id number]  Date of birth: 10/01/1980  HPI  Judith Garza is here for right lower abdominal pain.  ABDOMINAL PAIN  Has had chronic right lower abdominal pain for >1 year. Occurs 1-2 times per month and lasts for 1-3 days at a time.  Pain began 1 days ago after intercourse. She also had left sided abdominal pain this time which has not occurred before.  Medications tried: NSAIDs Similar pain before:yes  Prior abdominal surgeries: h/o C-sections   Symptoms Nausea/vomiting: no Diarrhea: no Constipation: no Blood in stool: no Blood in vomit: N/A Fever: no Dysuria: no Loss of appetite: no Weight loss: no  Missed menstrual period: no Vaginal discharge is present.    -  reports that she has never smoked. She has never used smokeless tobacco. - Review of Systems: Per HPI. - Past Medical History: Patient Active Problem List   Diagnosis Date Noted  . Abdominal pain, chronic, right lower quadrant 06/26/2016  . Vaginitis and vulvovaginitis 06/06/2012  . Healthcare maintenance 01/10/2012  . Consultation without specific complaint 01/10/2012  . VAGINITIS, CANDIDAL 03/10/2009  . CONSTIPATION 03/10/2009  . HEMATURIA UNSPECIFIED 03/10/2009  . CALLUS, LEFT FOOT 01/12/2009  . FOOT PAIN, LEFT 01/12/2009  . LUMBAR STRAIN 01/12/2009  . FLATULENCE 01/02/2008  . CANDIDIASIS 07/01/2007  . ECZEMA 05/23/2007   - Medications: reviewed and updated   Objective:   Physical Exam BP 112/80   Pulse (!) 59   Temp 98.5 F (36.9 C) (Oral)   Ht 5\' 4"  (1.626 m)   Wt 170 lb 6.4 oz (77.3 kg)   LMP 08/01/2016 (Approximate)   SpO2 99%   BMI 29.25 kg/m  Gen: NAD, alert, cooperative with exam, well-appearing Abd: +BS, soft, non-distended, TTP in RLQ, palpation in LLQ reproduces pain in RLQ, no rebound or guarding  GU/GYN: Exam performed in the presence of a chaperone. External genitalia within normal limits.  Vaginal mucosa pink, moist,  normal rugae.  Nonfriable cervix without lesion or bleeding noted on speculum exam. Mild amount of white, thin discharge present. Bimanual exam revealed normal, nongravid uterus. Mild cervical motion tenderness. Adnexal tenderness bilaterally R>L.   Assessment & Plan:   1. Abdominal pain, right lower quadrant Differential remains broad. Concern for PID given CMT and adnexal tenderness present on exam. Additionally, pain present after intercourse and new onset bilateral pain is also concerning. Patient does have history of +Chlamydia and questionable tubo-ovarian abscess. Will treat presumptively for PID with CTX 250 mg IM and 7 day course of Doxycycline. Abdominal exam findings could potentially be concerning for appendicitis; however, reassured by lack of fever, nausea, and vomiting. Will obtain CBC to evaluate WBC. If leukocytosis >15, would order CT abdomen to further evaluate. Given reported intermittent, chronic pain in this region may be related to ovarian pathology. Have ordered pelvic US to evaluate further. UA unremarkable and wet prep only significant for many bacteria. Upreg negative.  - POCT urine pregnancy - POCT urinalysis dipstick - Cervicovaginal ancillary only - POCT Wet Prep ([place]) - CBC - US Transvaginal Non-OB; Future - US Pelvis Complete; Future   Marcy Sirenatherine Libia Fazzini, D.O. 08/09/2016, 3:56 PM PGY-2, [facility]

## 2016-08-10 LAB — CBC
HEMOGLOBIN: 12.6 g/dL (ref 11.1–15.9)
Hematocrit: 37.4 % (ref 34.0–46.6)
MCH: 28.8 pg (ref 26.6–33.0)
MCHC: 33.7 g/dL (ref 31.5–35.7)
MCV: 86 fL (ref 79–97)
Platelets: 243 10*3/uL (ref 150–379)
RBC: 4.37 x10E6/uL (ref 3.77–5.28)
RDW: 14.1 % (ref 12.3–15.4)
WBC: 9.1 10*3/uL (ref 3.4–10.8)

## 2016-08-13 LAB — CERVICOVAGINAL ANCILLARY ONLY
CHLAMYDIA, DNA PROBE: NEGATIVE
NEISSERIA GONORRHEA: NEGATIVE

## 2016-08-21 ENCOUNTER — Ambulatory Visit (HOSPITAL_COMMUNITY)
Admission: RE | Admit: 2016-08-21 | Discharge: 2016-08-21 | Disposition: A | Discharge status: Home / Self Care | Payer: BLUE CROSS/BLUE SHIELD | Source: Ambulatory Visit | Attending: Family Medicine | Admitting: Family Medicine

## 2016-08-21 DIAGNOSIS — N83202 Unspecified ovarian cyst, left side: Secondary | ICD-10-CM | POA: Diagnosis not present

## 2016-08-21 DIAGNOSIS — G8929 Other chronic pain: Secondary | ICD-10-CM | POA: Diagnosis not present

## 2016-08-21 DIAGNOSIS — R1031 Right lower quadrant pain: Secondary | ICD-10-CM | POA: Diagnosis present

## 2016-08-23 ENCOUNTER — Telehealth: Payer: Self-pay | Admitting: *Deleted

## 2016-08-23 NOTE — Telephone Encounter (Signed)
Using [employer] [phone] and gave pt message below. Lamonte SakaiZimmerman Rumple, April D, [place]

## 2016-08-23 NOTE — Telephone Encounter (Signed)
-----   Message from Arvilla Marketatherine Lauren Wallace, DO sent at 08/14/2016  8:52 AM EDT ----- Please call patient to let her known Gonorrhea and Chlamydia were negative.   Marcy Sirenatherine Wallace, D.O. 08/14/2016, 8:52 AM PGY-2, [facility]

## 2016-08-27 ENCOUNTER — Telehealth: Payer: Self-pay | Admitting: *Deleted

## 2016-08-27 NOTE — Telephone Encounter (Signed)
Contacted pt via [employer] [phone] and gave her the below message. Lamonte SakaiZimmerman Rumple, Noriko Macari D, [place]

## 2016-08-27 NOTE — Telephone Encounter (Signed)
-----   Message from Arvilla Marketatherine Lauren Wallace, DO sent at 08/23/2016 11:52 AM EDT ----- Please let patient know that she had a normal cyst on her left ovary, the side opposite of her pain. This is not the likely cause of her pain. If she is still having pain she should follow up with her PCP.   Marcy Sirenatherine Wallace, D.O. 08/23/2016, 11:52 AM PGY-2, [facility]

## 2017-01-10 NOTE — Progress Notes (Deleted)
   Subjective:   Patient ID: Judith Garza    DOB: 1980-04-30, 36 y.o. female   MRN: [id number]  Judith Garza is a 36 y.o. female with a history of *** here for ***  Foot pain ***  Review of Systems:  Per HPI.   PMFSH: ***. Smoking status reviewed. Medications reviewed.  Objective:   There were no vitals taken for this visit. Vitals and nursing note reviewed.  General: well nourished, well developed, in no acute distress with non-toxic appearance HEENT: normocephalic, atraumatic, moist mucous membranes Neck: supple, non-tender without lymphadenopathy CV: regular rate and rhythm without murmurs, rubs, or gallops, no lower extremity edema Lungs: clear to auscultation bilaterally with normal work of breathing Abdomen: soft, non-tender, non-distended, no masses or organomegaly palpable, normoactive bowel sounds Skin: warm, dry, no rashes or lesions, cap refill < 2 seconds Extremities: warm and well perfused, normal tone MSK: Full ROM, strength intact, gait normal Neuro: Alert and oriented, speech normal  Assessment & Plan:   No problem-specific Assessment & Plan notes found for this encounter.  No orders of the defined types were placed in this encounter.  No orders of the defined types were placed in this encounter.   Ellwood Dense, DO PGY-1, [facility] 01/10/2017 6:22 PM

## 2017-01-11 ENCOUNTER — Ambulatory Visit: Payer: Self-pay | Admitting: Family Medicine

## 2017-04-08 ENCOUNTER — Other Ambulatory Visit: Payer: Self-pay

## 2017-04-08 ENCOUNTER — Ambulatory Visit (HOSPITAL_COMMUNITY)
Admission: EM | Admit: 2017-04-08 | Discharge: 2017-04-08 | Disposition: A | Discharge status: Home / Self Care | Payer: BLUE CROSS/BLUE SHIELD | Attending: Family Medicine | Admitting: Family Medicine

## 2017-04-08 ENCOUNTER — Ambulatory Visit (INDEPENDENT_AMBULATORY_CARE_PROVIDER_SITE_OTHER): Payer: BLUE CROSS/BLUE SHIELD

## 2017-04-08 ENCOUNTER — Encounter (HOSPITAL_COMMUNITY): Payer: Self-pay | Admitting: *Deleted

## 2017-04-08 DIAGNOSIS — M25511 Pain in right shoulder: Secondary | ICD-10-CM | POA: Diagnosis not present

## 2017-04-08 DIAGNOSIS — M25521 Pain in right elbow: Secondary | ICD-10-CM | POA: Diagnosis not present

## 2017-04-08 DIAGNOSIS — M79601 Pain in right arm: Secondary | ICD-10-CM

## 2017-04-08 NOTE — ED Triage Notes (Signed)
Per pt pain on the inner part of her right arm closer to her elbow, per pt when she pull or push she gets pain,

## 2017-04-08 NOTE — Discharge Instructions (Addendum)
Pain is most likely a muscular strain or elbow tendonitis. Pain may take 1-2 weeks to resolve.  Use anti-inflammatories for pain/swelling. You may take up to 800 mg Ibuprofen every 8 hours with food. You may supplement Ibuprofen with Tylenol [phone] mg every 8 hours.   Please alternate using ice/heating pad to help with any discomfort.  Please follow up here or with your primary care if symptoms not improving in 2 weeks. Please return sooner if pain changes, worsens, develop numbness, tingling.

## 2017-04-08 NOTE — ED Provider Notes (Signed)
[facility]    CSN: [id number] Arrival date & time: 04/08/17  1057     History   Chief Complaint Chief Complaint  Patient presents with  . Arm Pain    HPI Judith Garza is a 36 y.o. female presenting with right elbow pain for 1 month. Pain has worsened in the past 3-4 days. Pain is sharp and radiates to shoulder when pushing/pulling. Taking ibuprofen with relief. No numbness or tingling into forearm/hand. Works at [employer], has not been working over the past 2 weeks. No injury or increase in use.   HPI  History reviewed. No pertinent past medical history.  Patient Active Problem List   Diagnosis Date Noted  . Abdominal pain, chronic, right lower quadrant 06/26/2016  . Vaginitis and vulvovaginitis 06/06/2012  . Healthcare maintenance 01/10/2012  . Consultation without specific complaint 01/10/2012  . VAGINITIS, CANDIDAL 03/10/2009  . CONSTIPATION 03/10/2009  . HEMATURIA UNSPECIFIED 03/10/2009  . CALLUS, LEFT FOOT 01/12/2009  . FOOT PAIN, LEFT 01/12/2009  . LUMBAR STRAIN 01/12/2009  . FLATULENCE 01/02/2008  . CANDIDIASIS 07/01/2007  . ECZEMA 05/23/2007    Past Surgical History:  Procedure Laterality Date  . CESAREAN SECTION     three    OB History    Obstetric Comments   3 children via Csection. [name] was too big. [name] delivered quickly and [name] was decided to be a C-section.       Home Medications    Prior to Admission medications   Medication Sig Start Date End Date Taking? Authorizing Provider  doxycycline (VIBRA-TABS) 100 MG tablet Take 1 tablet (100 mg total) by mouth 2 (two) times daily. 08/09/16   Arvilla MarketWallace, Catherine Lauren, DO  famotidine (PEPCID) 40 MG tablet Take 1 tablet (40 mg total) by mouth 2 (two) times daily. 01/15/16 01/29/16  Isa RankinMurray, Laura Wilson, MD  ipratropium (ATROVENT) 0.06 % nasal spray Place 2 sprays into both nostrils 4 (four) times daily. 08/08/14   Linna HoffKindl, James D, MD  naproxen (NAPROSYN) 500 MG tablet Take 1 tablet (500 mg  total) by mouth 2 (two) times daily. 01/15/16   Isa RankinMurray, Laura Wilson, MD    Family History Family History  Problem Relation Age of Onset  . Diabetes Mother   . Hyperlipidemia Mother     Social History Social History   Tobacco Use  . Smoking status: Never Smoker  . Smokeless tobacco: Never Used  Substance Use Topics  . Alcohol use: No  . Drug use: No     Allergies   Patient has no known allergies.   Review of Systems Review of Systems  Constitutional: Negative for fatigue and fever.  Musculoskeletal: Positive for arthralgias. Negative for neck pain and neck stiffness.  Neurological: Negative for dizziness, weakness, light-headedness, numbness and headaches.  All other systems reviewed and are negative.    Physical Exam Triage Vital Signs ED Triage Vitals  Enc Vitals Group     BP 04/08/17 1151 108/69     Pulse Rate 04/08/17 1151 60     Resp --      Temp 04/08/17 1151 98.7 F (37.1 C)     Temp Source 04/08/17 1151 Oral     SpO2 04/08/17 1151 99 %     Weight --      Height --      Head Circumference --      Peak Flow --      Pain Score 04/08/17 1148 10     Pain Loc --  Pain Edu? --      Excl. in GC? --    No data found.  Updated Vital Signs BP 108/69 (BP Location: Left Arm)   Pulse 60   Temp 98.7 F (37.1 C) (Oral)   LMP 03/09/2017 (Approximate)   SpO2 99%   Visual Acuity Right Eye Distance:   Left Eye Distance:   Bilateral Distance:    Right Eye Near:   Left Eye Near:    Bilateral Near:     Physical Exam  Constitutional: She is oriented to person, place, and time. She appears well-developed and well-nourished.  HENT:  Head: Normocephalic and atraumatic.  Cardiovascular: Normal rate.  Pulmonary/Chest: Effort normal. No respiratory distress.  Musculoskeletal:  Right arm:  Shoulder: mild tenderness to palpation along posterior shoulder, Full ROM, mild pain with over head motions and forward flexion Elbow: Mild tenderness to palpation of  medial epicondyle. Strength 5/5, pain with flexion and extension. Wrist: Strength 5/5 at wrist, pain with flexion Radial pulse 2+  Neurological: She is alert and oriented to person, place, and time.     UC Treatments / Results  Labs (all labs ordered are listed, but only abnormal results are displayed) Labs Reviewed - No data to display  EKG  EKG Interpretation None       Radiology Dg Shoulder Right  Result Date: 04/08/2017 CLINICAL DATA:  Right shoulder pain after falling 1 month ago, worsening over the past 3-4 days. EXAM: RIGHT SHOULDER - 2+ VIEW COMPARISON:  None. FINDINGS: There is no evidence of fracture or dislocation. There is no evidence of arthropathy or other focal bone abnormality. Soft tissues are unremarkable. IMPRESSION: Normal examination. Electronically Signed   By: Beckie SaltsSteven  Reid M.D.   On: 04/08/2017 13:21   Dg Elbow Complete Right  Result Date: 04/08/2017 CLINICAL DATA:  Right elbow pain following a fall 1 month ago, worsened over the past 3-4 days. EXAM: RIGHT ELBOW - COMPLETE 3+ VIEW COMPARISON:  None. FINDINGS: There is no evidence of fracture, dislocation, or joint effusion. There is no evidence of arthropathy or other focal bone abnormality. Soft tissues are unremarkable. IMPRESSION: Normal examination. Electronically Signed   By: Beckie SaltsSteven  Reid M.D.   On: 04/08/2017 13:21    Procedures Procedures (including critical care time)  Medications Ordered in UC Medications - No data to display   Initial Impression / Assessment and Plan / UC Course  I have reviewed the triage vital signs and the nursing notes.  Pertinent labs & imaging results that were available during my care of the patient were reviewed by me and considered in my medical decision making (see chart for details).     No evidence of fracture on Xray, likely muscular strain vs. Tendonitis vs epicondylitis. Advised anti-inflammatories, ice/heat. May try brace for golfer/tennis elbow. Exercises  for strengthening provided. Discussed return precautions. Patient verbalized understanding and is agreeable with plan.   Final Clinical Impressions(s) / UC Diagnoses   Final diagnoses:  Right arm pain    ED Discharge Orders    None       Controlled Substance Prescriptions [place] Controlled Substance Registry consulted? Not Applicable   Lew DawesWieters, Dondrea Clendenin C, [place]C 04/08/17 1958

## 2018-03-05 ENCOUNTER — Telehealth: Payer: Self-pay

## 2018-03-05 NOTE — Telephone Encounter (Signed)
Received voicemail in Spanish from [phone], Mitzi HansenNancy Mires.  [facility] [place], # [phone]. Received voicemail. Message left to call back.  Ples SpecterAlisa , RN  [facility])

## 2019-10-24 IMAGING — DX DG ELBOW COMPLETE 3+V*R*
4 series · 4 of 4 positions shown · non-contrast
Comparison: None.

CLINICAL DATA: Right elbow pain following a fall 1 month ago,
worsened over the past 3-4 days.

EXAM:
RIGHT ELBOW - COMPLETE 3+ VIEW

[elbow ap]
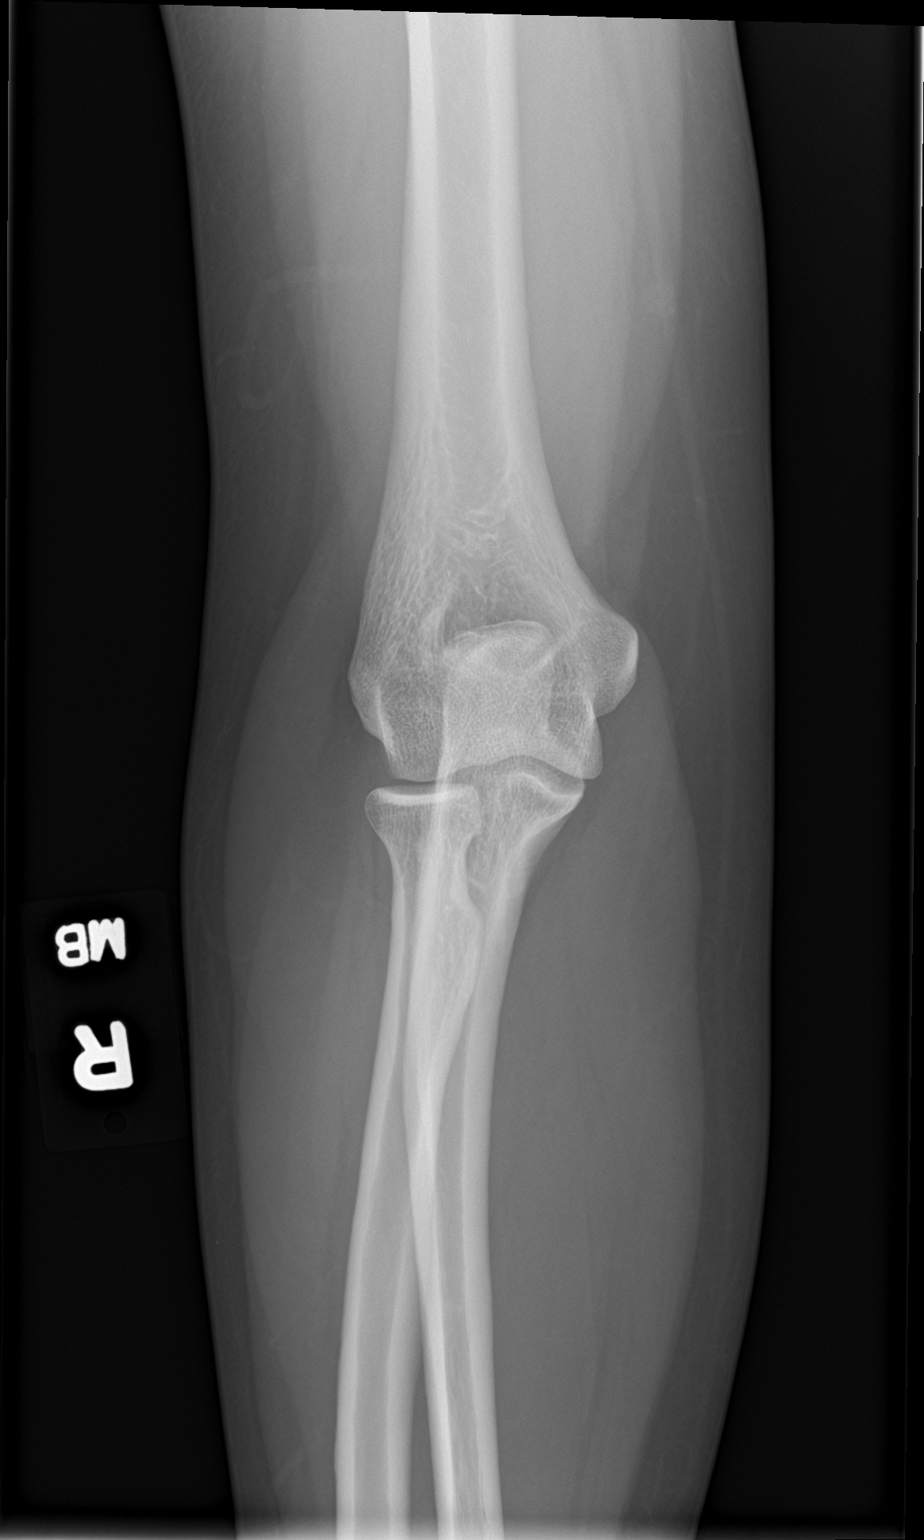

[elbow obl (1 of 2)]
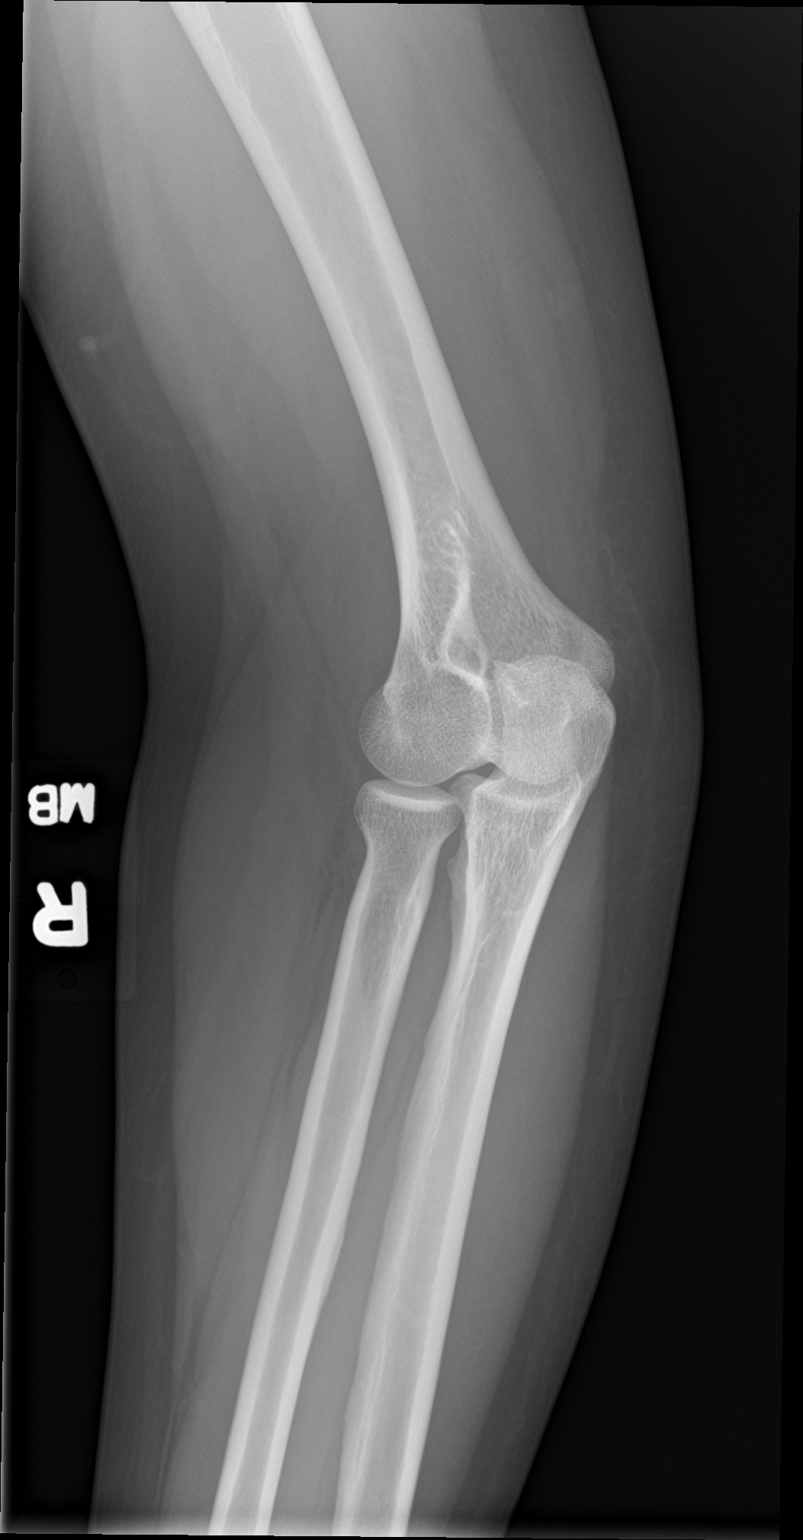

[elbow obl (2 of 2)]
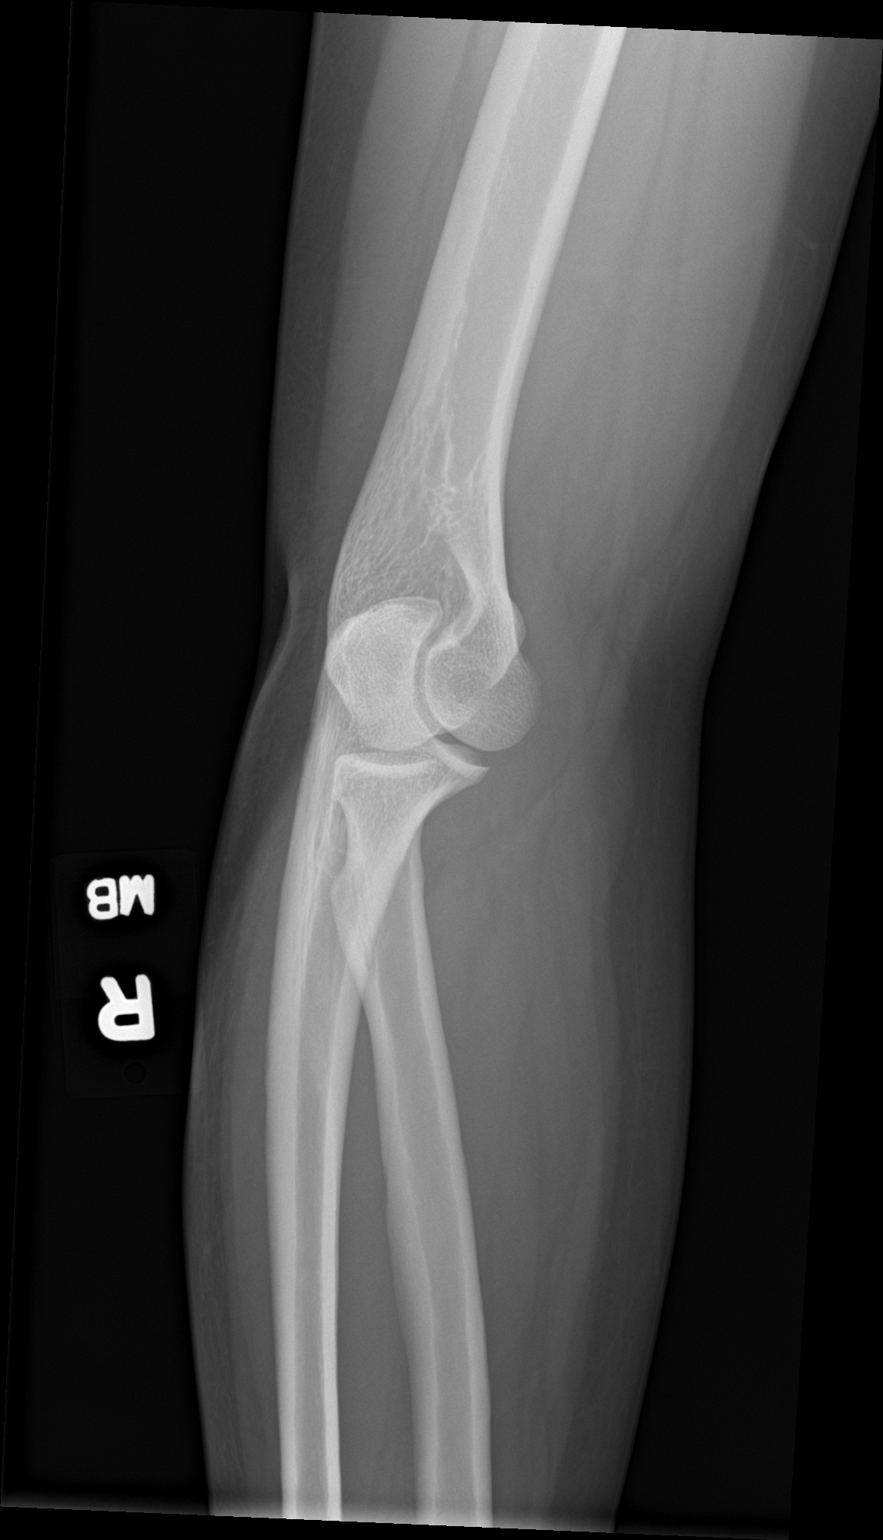

[elbow lat]
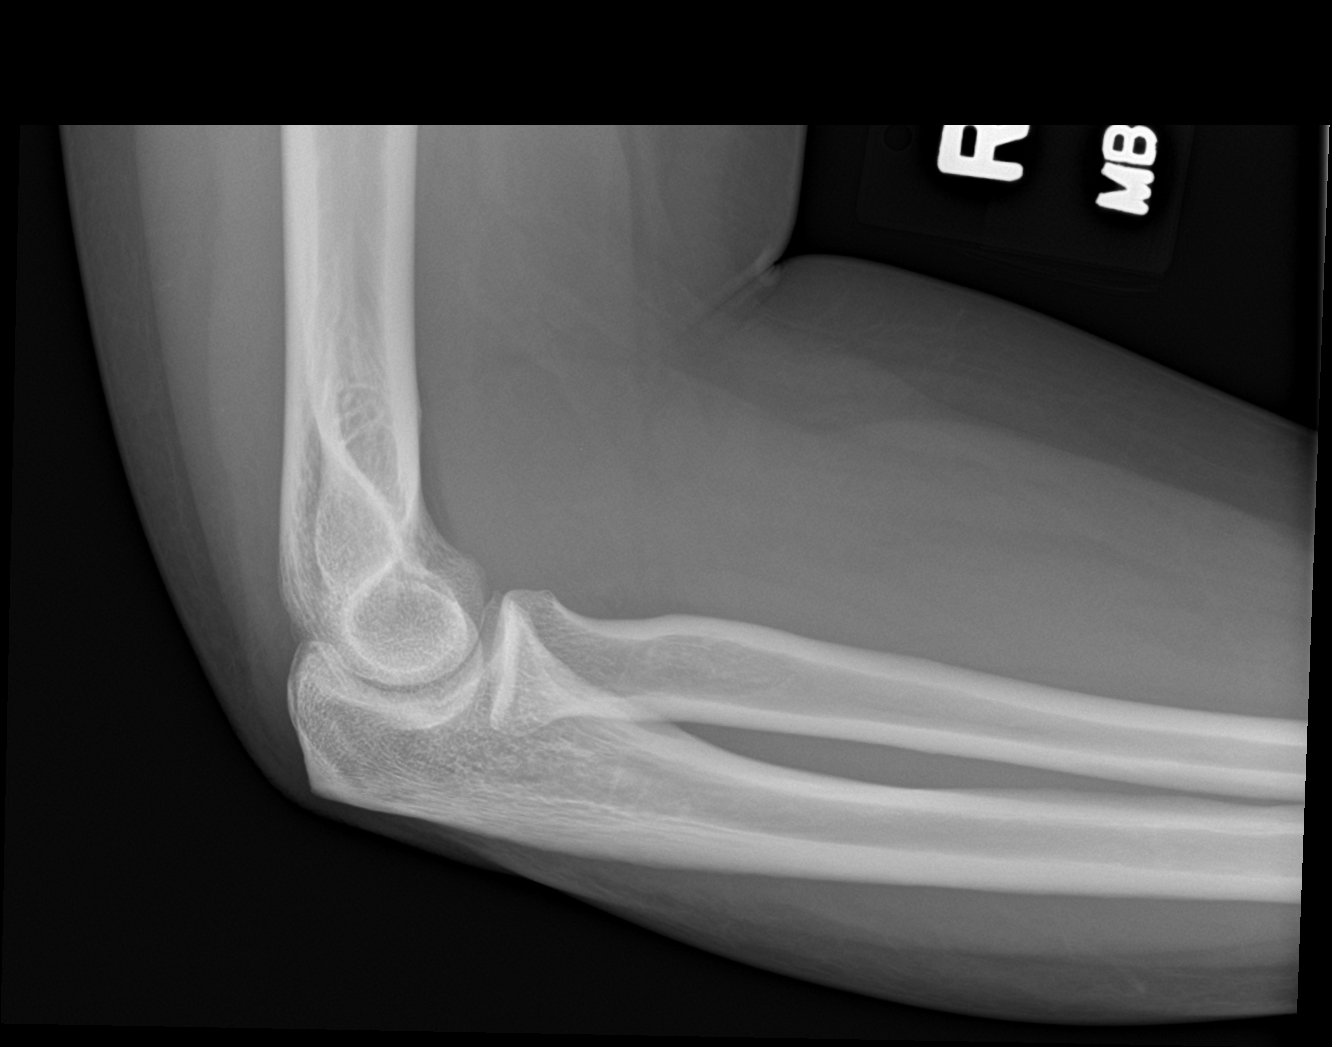

[4 of 4 positions shown; findings below may reference images not displayed]

FINDINGS: There is no evidence of fracture, dislocation, or joint effusion.
There is no evidence of arthropathy or other focal bone abnormality.
Soft tissues are unremarkable.
IMPRESSION: Normal examination.

## 2020-05-12 ENCOUNTER — Ambulatory Visit: Payer: BLUE CROSS/BLUE SHIELD | Admitting: Physician Assistant
